# Patient Record
Sex: Male | Born: 1995 | Race: White | Hispanic: No | Marital: Single | Smoking: Current every day smoker
Health system: Southern US, Community
[De-identification: ages and names within clinical notes are randomized; demographics above are authoritative.]

---

## 2020-04-29 ENCOUNTER — Emergency Department (HOSPITAL_COMMUNITY)
Admission: EM | Admit: 2020-04-29 | Discharge: 2020-04-29 | Disposition: A | Discharge status: Home / Self Care | Payer: No Typology Code available for payment source | Attending: Emergency Medicine | Admitting: Emergency Medicine

## 2020-04-29 ENCOUNTER — Emergency Department (HOSPITAL_COMMUNITY): Payer: No Typology Code available for payment source

## 2020-04-29 ENCOUNTER — Encounter (HOSPITAL_COMMUNITY): Payer: Self-pay

## 2020-04-29 ENCOUNTER — Other Ambulatory Visit: Payer: Self-pay

## 2020-04-29 DIAGNOSIS — S2231XA Fracture of one rib, right side, initial encounter for closed fracture: Secondary | ICD-10-CM | POA: Diagnosis not present

## 2020-04-29 DIAGNOSIS — F17200 Nicotine dependence, unspecified, uncomplicated: Secondary | ICD-10-CM | POA: Insufficient documentation

## 2020-04-29 DIAGNOSIS — M549 Dorsalgia, unspecified: Secondary | ICD-10-CM

## 2020-04-29 DIAGNOSIS — R109 Unspecified abdominal pain: Secondary | ICD-10-CM | POA: Diagnosis not present

## 2020-04-29 DIAGNOSIS — W19XXXA Unspecified fall, initial encounter: Secondary | ICD-10-CM | POA: Insufficient documentation

## 2020-04-29 DIAGNOSIS — S32009A Unspecified fracture of unspecified lumbar vertebra, initial encounter for closed fracture: Secondary | ICD-10-CM | POA: Diagnosis not present

## 2020-04-29 DIAGNOSIS — S3992XA Unspecified injury of lower back, initial encounter: Secondary | ICD-10-CM | POA: Diagnosis present

## 2020-04-29 LAB — CBC WITH DIFFERENTIAL/PLATELET
Abs Immature Granulocytes: 0.07 10*3/uL (ref 0.00–0.07)
Basophils Absolute: 0.1 10*3/uL (ref 0.0–0.1)
Basophils Relative: 0 %
Eosinophils Absolute: 0 10*3/uL (ref 0.0–0.5)
Eosinophils Relative: 0 %
HCT: 45.6 % (ref 39.0–52.0)
Hemoglobin: 15.2 g/dL (ref 13.0–17.0)
Immature Granulocytes: 0 %
Lymphocytes Relative: 14 %
Lymphs Abs: 2.2 10*3/uL (ref 0.7–4.0)
MCH: 29.3 pg (ref 26.0–34.0)
MCHC: 33.3 g/dL (ref 30.0–36.0)
MCV: 87.9 fL (ref 80.0–100.0)
Monocytes Absolute: 1.1 10*3/uL — ABNORMAL HIGH (ref 0.1–1.0)
Monocytes Relative: 7 %
Neutro Abs: 12.4 10*3/uL — ABNORMAL HIGH (ref 1.7–7.7)
Neutrophils Relative %: 79 %
Platelets: 236 10*3/uL (ref 150–400)
RBC: 5.19 MIL/uL (ref 4.22–5.81)
RDW: 12.7 % (ref 11.5–15.5)
WBC: 15.8 10*3/uL — ABNORMAL HIGH (ref 4.0–10.5)
nRBC: 0 % (ref 0.0–0.2)

## 2020-04-29 LAB — HEPATIC FUNCTION PANEL
ALT: 29 U/L (ref 0–44)
AST: 32 U/L (ref 15–41)
Albumin: 4.1 g/dL (ref 3.5–5.0)
Alkaline Phosphatase: 62 U/L (ref 38–126)
Bilirubin, Direct: <0.1 mg/dL (ref 0.0–0.2)
Total Bilirubin: 0.3 mg/dL (ref 0.3–1.2)
Total Protein: 6.9 g/dL (ref 6.5–8.1)

## 2020-04-29 LAB — I-STAT CHEM 8, ED
BUN: 14 mg/dL (ref 6–20)
Calcium, Ion: 1.18 mmol/L (ref 1.15–1.40)
Chloride: 107 mmol/L (ref 98–111)
Creatinine, Ser: 0.7 mg/dL (ref 0.61–1.24)
Glucose, Bld: 94 mg/dL (ref 70–99)
HCT: 44 % (ref 39.0–52.0)
Hemoglobin: 15 g/dL (ref 13.0–17.0)
Potassium: 3.4 mmol/L — ABNORMAL LOW (ref 3.5–5.1)
Sodium: 143 mmol/L (ref 135–145)
TCO2: 25 mmol/L (ref 22–32)

## 2020-04-29 LAB — BASIC METABOLIC PANEL
Anion gap: 12 (ref 5–15)
BUN: 13 mg/dL (ref 6–20)
CO2: 22 mmol/L (ref 22–32)
Calcium: 9.3 mg/dL (ref 8.9–10.3)
Chloride: 108 mmol/L (ref 98–111)
Creatinine, Ser: 0.84 mg/dL (ref 0.61–1.24)
GFR, Estimated: >60 mL/min (ref >60)
Glucose, Bld: 96 mg/dL (ref 70–99)
Potassium: 3.5 mmol/L (ref 3.5–5.1)
Sodium: 142 mmol/L (ref 135–145)

## 2020-04-29 LAB — PROTIME-INR
INR: 1.1 (ref 0.8–1.2)
Prothrombin Time: 14 seconds (ref 11.4–15.2)

## 2020-04-29 MED ORDER — CYCLOBENZAPRINE HCL 10 MG PO TABS: 10.0000 mg | ORAL_TABLET | Freq: Two times a day (BID) | ORAL | 0 refills | Status: DC | PRN

## 2020-04-29 MED ORDER — FENTANYL CITRATE (PF) 100 MCG/2ML IJ SOLN
100.0000 ug | Freq: Once | INTRAMUSCULAR | Status: AC
  Administered 2020-04-29: 100 ug via INTRAVENOUS
  Filled 2020-04-29: qty 2

## 2020-04-29 MED ORDER — FENTANYL CITRATE (PF) 100 MCG/2ML IJ SOLN
50.0000 ug | Freq: Once | INTRAMUSCULAR | Status: AC
  Administered 2020-04-29: 50 ug via INTRAVENOUS
  Filled 2020-04-29: qty 2

## 2020-04-29 MED ORDER — OXYCODONE-ACETAMINOPHEN 5-325 MG PO TABS
1.0000 | ORAL_TABLET | ORAL | 0 refills | Status: AC | PRN
Start: 2020-04-29 — End: 2020-05-02

## 2020-04-29 MED ORDER — IOHEXOL 300 MG/ML  SOLN
125.0000 mL | Freq: Once | INTRAMUSCULAR | Status: AC | PRN
  Administered 2020-04-29: 125 mL via INTRAVENOUS

## 2020-04-29 MED ORDER — IOHEXOL 300 MG/ML  SOLN: 125.0000 mL | Freq: Once | INTRAMUSCULAR | Status: DC | PRN

## 2020-04-29 MED ORDER — OXYCODONE-ACETAMINOPHEN 5-325 MG PO TABS
1.0000 | ORAL_TABLET | Freq: Once | ORAL | Status: AC
  Administered 2020-04-29: 1 via ORAL
  Filled 2020-04-29: qty 1

## 2020-04-29 NOTE — ED Triage Notes (Signed)
Pt from urgent care; 3 pm pt fell from deck when deck collapsed, fell 10-15 feet; no loc, did not hit head, no thinners; c/o pain R side with movement, inspiration; tenderness to R hip and abdomen, R flank, and R axillary space; no swelling or extensive bruising noted with ems; pt endorses feeling a "pop" on back/mid-axillary space when being moved by ems; mild tender back pain; pt and o x 4 on arrival; pain went from 10 to 4 with 200 mcg fentanyl total; no neuro deficits, ambulatory on scene  122/78 HR 82 RR 18 100% RA

## 2020-04-29 NOTE — ED Notes (Signed)
Pt waiting for registration

## 2020-04-29 NOTE — ED Notes (Signed)
Pt back from ct

## 2020-04-29 NOTE — ED Notes (Signed)
X-ray at bedside

## 2020-04-29 NOTE — ED Provider Notes (Signed)
[facility] EMERGENCY DEPARTMENT Provider Note   CSN: [id number] Arrival date & time: 04/29/20  1839     History Chief Complaint  Patient presents with  . Fall    Aaron Day is a 24 y.o. male.  No medical problems, not on blood thinners presents to ER after fall.  Estimates he fell approximately 10 feet onto his right side from deck.  Is having severe pain along his right side, right lower back.  10 out of 10, worse with movement, improved with fentanyl from EMS.  No chest pain, no difficulty breathing.  He denies head trauma, no neck pain, no loss of consciousness.  HPI     History reviewed. No pertinent past medical history.  There are no problems to display for this patient.   History reviewed. No pertinent surgical history.     No family history on file.  Social History   Tobacco Use  . Smoking status: Current Every Day Smoker    Packs/day: 1.00  Substance Use Topics  . Alcohol use: Yes    Comment: occ  . Drug use: Yes    Types: Marijuana    Home Medications Prior to Admission medications   Medication Sig Start Date End Date Taking? Authorizing Provider  cyclobenzaprine (FLEXERIL) 10 MG tablet Take 1 tablet (10 mg total) by mouth 2 (two) times daily as needed for muscle spasms. 04/29/20   Milagros Loll, MD  oxyCODONE-acetaminophen (PERCOCET) 5-325 MG tablet Take 1 tablet by mouth every 4 (four) hours as needed for up to 3 days for severe pain. 04/29/20 05/02/20  Milagros Loll, MD    Allergies    Ceclor [cefaclor] and Keflex [cephalexin]  Review of Systems   Review of Systems  Constitutional: Negative for chills and fever.  HENT: Negative for ear pain and sore throat.   Eyes: Negative for pain and visual disturbance.  Respiratory: Negative for cough and shortness of breath.   Cardiovascular: Negative for chest pain and palpitations.  Gastrointestinal: Positive for abdominal pain. Negative for vomiting.  Genitourinary:  Positive for frequency. Negative for dysuria and hematuria.  Musculoskeletal: Positive for back pain. Negative for arthralgias.  Skin: Negative for color change and rash.  Neurological: Negative for seizures and syncope.  All other systems reviewed and are negative.   Physical Exam Updated Vital Signs BP 110/88 (BP Location: Right Arm)   Pulse 88   Temp 99 F (37.2 C) (Oral)   Resp 16   Ht 6' (1.829 m)   Wt 117.9 kg   SpO2 99%   BMI 35.26 kg/m   Physical Exam Vitals and nursing note reviewed.  Constitutional:      Appearance: He is well-developed.  HENT:     Head: Normocephalic and atraumatic.  Eyes:     Conjunctiva/sclera: Conjunctivae normal.  Cardiovascular:     Rate and Rhythm: Normal rate and regular rhythm.     Heart sounds: No murmur heard.   Pulmonary:     Effort: Pulmonary effort is normal. No respiratory distress.     Breath sounds: Normal breath sounds.     Comments: Tenderness to palpation over right lower chest wall Abdominal:     Palpations: Abdomen is soft.     Comments: Tenderness to palpation over right side of abdomen, right flank, no rebound or guarding  Musculoskeletal:     Cervical back: Neck supple.     Comments: Back: There is tenderness along L spine, no step off or deformity RUE: no  TTP throughout, no deformity, normal joint ROM, radial pulse intact, distal sensation and motor intact LUE: no TTP throughout, no deformity, normal joint ROM, radial pulse intact, distal sensation and motor intact RLE:  no TTP throughout, no deformity, normal joint ROM, distal pulse, sensation and motor intact LLE: no TTP throughout, no deformity, normal joint ROM, distal pulse, sensation and motor intact  Skin:    General: Skin is warm and dry.  Neurological:     Mental Status: He is alert.     ED Results / Procedures / Treatments   Labs (all labs ordered are listed, but only abnormal results are displayed) Labs Reviewed  CBC WITH DIFFERENTIAL/PLATELET -  Abnormal; Notable for the following components:      Result Value   WBC 15.8 (*)    Neutro Abs 12.4 (*)    Monocytes Absolute 1.1 (*)    All other components within normal limits  I-STAT CHEM 8, ED - Abnormal; Notable for the following components:   Potassium 3.4 (*)    All other components within normal limits  BASIC METABOLIC PANEL  PROTIME-INR  HEPATIC FUNCTION PANEL    EKG None  Radiology DG Ribs Unilateral W/Chest Right  Result Date: 04/29/2020 CLINICAL DATA:  Right-sided pain status post fall. EXAM: RIGHT RIBS AND CHEST - 3+ VIEW COMPARISON:  None. FINDINGS: Findings are suspicious for nondisplaced fracture of the posterior twelfth rib on the right. There is no pneumothorax. The lungs are clear. IMPRESSION: Findings suspicious for nondisplaced fracture of the posterior right twelfth rib. No pneumothorax. Electronically Signed   By: Katherine Mantle M.D.   On: 04/29/2020 20:17   CT ABDOMEN PELVIS W CONTRAST  Result Date: 04/29/2020 CLINICAL DATA:  Larey Seat 10 foot from staircase, back pain EXAM: CT ABDOMEN AND PELVIS WITH CONTRAST TECHNIQUE: Multidetector CT imaging of the abdomen and pelvis was performed using the standard protocol following bolus administration of intravenous contrast. CONTRAST:  OMNIPAQUE IOHEXOL 300 MG/ML  SOLN COMPARISON:  CT 04/29/2020, radiograph 04/29/2020 FINDINGS: Lower chest: No acute abnormality. Hepatobiliary: No focal liver abnormality is seen. No gallstones, gallbladder wall thickening, or biliary dilatation. Pancreas: Unremarkable. No pancreatic ductal dilatation or surrounding inflammatory changes. Spleen: Normal in size without focal abnormality. Adrenals/Urinary Tract: Adrenal glands are unremarkable. Kidneys are normal, without renal calculi, focal lesion, or hydronephrosis. Bladder is unremarkable. Stomach/Bowel: Stomach is within normal limits. Appendix appears normal. No evidence of bowel wall thickening, distention, or inflammatory changes.  Vascular/Lymphatic: No significant vascular findings are present. No enlarged abdominal or pelvic lymph nodes. Reproductive: Prostate is unremarkable. Other: No abdominal wall hernia or abnormality. No abdominopelvic ascites. Musculoskeletal: Acute nondisplaced right transverse process fractures at L1, L2 and L3. IMPRESSION: 1. No CT evidence for acute intra-abdominal or pelvic abnormality. 2. Acute nondisplaced right transverse process fractures at L1, L2 and L3. Electronically Signed   By: Jasmine Pang M.D.   On: 04/29/2020 21:33   CT L-SPINE NO CHARGE  Result Date: 04/29/2020 CLINICAL DATA:  Fall down 10 foot staircase. Staircase gave way. Back pain. EXAM: CT LUMBAR SPINE WITHOUT CONTRAST TECHNIQUE: Multidetector CT imaging of the lumbar spine was performed without intravenous contrast administration. Multiplanar CT image reconstructions were also generated. COMPARISON:  None FINDINGS: Segmentation: 5 non rib-bearing lumbar type vertebral bodies are present. The lowest fully formed vertebral body is L5. Alignment: No significant listhesis is present. Vertebrae: Vertebral body heights and alignment are maintained. Nondisplaced right transverse process fractures are present at L1, L2, and L3. Degenerative changes are noted  at the SI joints. No additional fractures are present. Hips are located. Paraspinal and other soft tissues: Paraspinous soft tissues are within limits. Please see CT abdomen of same day for further details. Disc levels: Rightward disc protrusion is present at L5-S1 mild right foraminal narrowing. No other focal disc disease or stenosis is present. IMPRESSION: 1. Nondisplaced right transverse process fractures at L1, L2, and L3. 2. No other acute or healing fractures. 3. Rightward disc protrusion at L5-S1 with mild right foraminal narrowing. Electronically Signed   By: Marin Roberts M.D.   On: 04/29/2020 21:17    Procedures Procedures (including critical care time)  Medications  Ordered in ED Medications  fentaNYL (SUBLIMAZE) injection 100 mcg (100 mcg Intravenous Given 04/29/20 2013)  iohexol (OMNIPAQUE) 300 MG/ML solution 125 mL (125 mLs Intravenous Contrast Given 04/29/20 2039)  fentaNYL (SUBLIMAZE) injection 50 mcg (50 mcg Intravenous Given 04/29/20 2233)  oxyCODONE-acetaminophen (PERCOCET/ROXICET) 5-325 MG per tablet 1 tablet (1 tablet Oral Given 04/29/20 2230)    ED Course  I have reviewed the triage vital signs and the nursing notes.  Pertinent labs & imaging results that were available during my care of the patient were reviewed by me and considered in my medical decision making (see chart for details).    MDM Rules/Calculators/A&P                          24 year old male fell from approximately 10 feet.  On physical exam noted tenderness over right flank, right lower back.  Plain films of the chest concerning for possible nondisplaced 12th rib fracture.  CT abdomen pelvis negative for acute abdominal pelvic pathology however, CT of L-spine was concerning for nondisplaced transverse process fractures of L1 and L3.  Reviewed with neurosurgery on-call, discussed with PA Dell Ponto who reviewed with [place] - no role for bracing, pain control and discharge okay for this injury.  Pain well controlled in ER, gave incentive spirometer, Rx for pain medicine and muscle relaxer.  Follow-up with primary doctor and neurosurgery.    After the discussed management above, the patient was determined to be safe for discharge.  The patient was in agreement with this plan and all questions regarding their care were answered.  ED return precautions were discussed and the patient will return to the ED with any significant worsening of condition.  Final Clinical Impression(s) / ED Diagnoses Final diagnoses:  Back pain  Closed fracture of transverse process of lumbar vertebra, initial encounter (HCC)  Closed fracture of one rib of right side, initial encounter    Rx / DC  Orders ED Discharge Orders         Ordered    oxyCODONE-acetaminophen (PERCOCET) 5-325 MG tablet  Every 4 hours PRN        04/29/20 2310    cyclobenzaprine (FLEXERIL) 10 MG tablet  2 times daily PRN        04/29/20 2310           Milagros Loll, MD 04/30/20 1628

## 2020-04-29 NOTE — Discharge Instructions (Addendum)
Recommend taking Tylenol and Motrin for pain control.  For breakthrough pain, take the prescribed Percocet as needed.  Additionally take the prescribed muscle relaxer as needed.  Please note both of these medications can make you drowsy and should not be taken while driving or operating heavy machinery.  Use the incentive spirometer as discussed.  If you develop uncontrolled pain, difficulty in breathing, chest pain, worse abdominal pain or other new concerning symptom, return to ER for reassessment.  Recommend follow-up with your primary doctor as well as neurosurgery.

## 2020-04-29 NOTE — ED Notes (Signed)
Pt back form xray.

## 2020-05-06 ENCOUNTER — Ambulatory Visit: Payer: Self-pay | Attending: Internal Medicine | Admitting: Internal Medicine

## 2020-05-06 ENCOUNTER — Encounter: Payer: Self-pay | Admitting: Internal Medicine

## 2020-05-06 ENCOUNTER — Other Ambulatory Visit: Payer: Self-pay

## 2020-05-06 VITALS — BP 126/86 | HR 91 | Resp 16 | Ht 72.0 in | Wt 281.0 lb

## 2020-05-06 DIAGNOSIS — S32009A Unspecified fracture of unspecified lumbar vertebra, initial encounter for closed fracture: Secondary | ICD-10-CM

## 2020-05-06 DIAGNOSIS — F17200 Nicotine dependence, unspecified, uncomplicated: Secondary | ICD-10-CM

## 2020-05-06 DIAGNOSIS — S2231XA Fracture of one rib, right side, initial encounter for closed fracture: Secondary | ICD-10-CM

## 2020-05-06 DIAGNOSIS — R030 Elevated blood-pressure reading, without diagnosis of hypertension: Secondary | ICD-10-CM

## 2020-05-06 MED ORDER — CYCLOBENZAPRINE HCL 10 MG PO TABS: 10.0000 mg | ORAL_TABLET | Freq: Two times a day (BID) | ORAL | 0 refills | Status: DC | PRN

## 2020-05-06 MED ORDER — OXYCODONE-ACETAMINOPHEN 5-325 MG PO TABS: 1.0000 | ORAL_TABLET | Freq: Three times a day (TID) | ORAL | 0 refills | Status: AC | PRN

## 2020-05-06 NOTE — Progress Notes (Signed)
Patient ID: Aaron Day, male    DOB: 22-May-1996  MRN: [id number]  CC: Hospitalization Follow-up (ED)   Subjective: Aaron Day is a 24 y.o. male who presents for new pt visit and f/u from ER. His concerns today include:  Hx of tob dep, panic/anxiety disorder  Previous was Dr. Rance MuirGuild with [facility] in [place].  Last seen about 2 yrs ago.  Lives [place] now.  Past hx of anxiety/panic disorder but not an active issue for him at this time.  No chronic meds.    Patient seen in the emergency room 04/29/2020 after he fell about 10 feet when a flight of deck stairs collapsed under him.  He fell onto his right side.  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  He was seen in the emergency room complaining of pain on his right side and right lower back.  X-ray of the ribs revealed a possible nondisplaced fracture of the posterior 12th rib.  CAT scan of the abdomen revealed no acute intra-abdominal or pelvic abnormality but did show acute nondisplaced right transverse process fractures at L1, L2 and L3.  CAT scan of the lumbar spine also revealed a nondisplaced fracture of the right transverse processes and a rightward disc protrusion at L5-S1.  The case was reviewed with the neurosurgeon on call who is Dr. Johnsie Cancelstergaard.  No role for bracing.  Was recommended that he be sent home with pain control and to follow-up with his PCP and the neurosurgeon.  Patient discharged with a limited supply of oxycodone and Flexeril.  Since then, patient reports that he still has pain around the lower rib cage anteriorly and posteriorly on the right side.  It is worse with movement of the trunk, coughing, sneezing or bending.  The pain in the right lower back he feels is more manageable compared to the pain around the rib cage.  However the back pain is worse when he is having to get up out of a chair or his car and getting out of bed.  This morning was the first morning that he was able to  sit up in bed without having to roll out of bed.  Pain is 10 out of 10 when moving his trunk.  However if he is just sitting the pain is about 5-6/10.  Reports having had a slipped disc 2 years ago associated with heavy lifting. -He denies any radiation of pain down the legs.  No numbness or tingling in the legs.  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 -He finished the oxycodone that was prescribed from the ER on the eighth.  Took the last dose this morning.  He reports some dizziness when he lays flat on his back in bed after taking the oxycodone and Flexeril.  No drowsiness when he is driving or up moving around.  He tried calling Dr. Johnsie Cancelstergaard office today and was told that he can call them back for an appointment once he has a claims # for his Workmen's Comp.  Tobacco dependence: He smokes about a pack a day and has smoked for 10 years.  He is wanting to quit.  He is thinking about using vaping as a method to help him quit.  Tried Chantix in  the past but stopped it due to vivid dreams.  Try the nicotine gum.  He has found that just chewing any gum helps. -He uses marijuana a few times a week.  Denies any other street drug use. -Gives family history of dependence on controlled substances in both of his parents, father now deceased.  Past medical, social, family history, surgical history reviewed and updated.  No current outpatient medications on file prior to visit.   No current facility-administered medications on file prior to visit.    Allergies  Allergen Reactions  . Ceclor [Cefaclor] Anaphylaxis  . Keflex [Cephalexin] Anaphylaxis    Social History   Socioeconomic History  . Marital status: Single    Spouse name: Not on file  . Number of children: 0  . Years of education: Not on file  . Highest education level:  12th grade  Occupational History  . Occupation: pest control  Tobacco Use  . Smoking status: Current Every Day Smoker    Packs/day: 1.00    Years: 10.00    Pack years: 10.00  . Smokeless tobacco: Never Used  Vaping Use  . Vaping Use: Never used  Substance and Sexual Activity  . Alcohol use: Yes    Comment: occ  . Drug use: Yes    Types: Marijuana    Comment: 1-2 x/wk  . Sexual activity: Not on file  Other Topics Concern  . Not on file  Social History Narrative  . Not on file   Social Determinants of Health   Financial Resource Strain:   . Difficulty of Paying Living Expenses: Not on file  Food Insecurity:   . Worried About Programme researcher, broadcasting/film/video in the Last Year: Not on file  . Ran Out of Food in the Last Year: Not on file  Transportation Needs:   . Lack of Transportation (Medical): Not on file  . Lack of Transportation (Non-Medical): Not on file  Physical Activity:   . Days of Exercise per Week: Not on file  . Minutes of Exercise per Session: Not on file  Stress:   . Feeling of Stress : Not on file  Social Connections:   . Frequency of Communication with Friends and Family: Not on file  . Frequency of Social Gatherings with Friends and Family: Not on file  . Attends Religious Services: Not on file  . Active Member of Clubs or Organizations: Not on file  . Attends Banker Meetings: Not on file  . Marital Status: Not on file  Intimate Partner Violence:   . Fear of Current or Ex-Partner: Not on file  . Emotionally Abused: Not on file  . Physically Abused: Not on file  . Sexually Abused: Not on file    Family History  Problem Relation Age of Onset  . Crohn's disease Father   . Crohn's disease Sister   . Crohn's disease Paternal Uncle   . Breast cancer Maternal Grandmother     No past surgical history on file.  ROS: Review of Systems Negative except as stated above  PHYSICAL EXAM: BP 126/86   Pulse 91   Resp 16   Ht 6' (1.829 m)   Wt 281 lb  (127.5 kg)   SpO2 97%   BMI 38.11 kg/m   Physical Exam  General appearance - alert, well appearing, obese young Caucasian male and in no distress Mental status - normal mood, behavior, speech, dress, motor activity, and thought processes Chest - clear to auscultation, no wheezes, rales or  rhonchi, symmetric air entry Heart - normal rate, regular rhythm, normal S1, S2, no murmurs, rubs, clicks or gallops Neurological -power in both lower extremities 5/5 bilaterally.  Gross sensation intact in the legs.  Patient thought it would be uncomfortable for him to lay back for [place] to do straight leg raise maneuver so I decided not to do it. Musculoskeletal -he is exquisitely tender on the right lower rib cage anteriorly laterally and posteriorly.  Mild to moderate tenderness on palpation of the right-sided lumbar paraspinal muscle.  No tenderness on palpation over the lumbar spine.   Extremities -no lower extremity edema.  CMP Latest Ref Rng & Units 04/29/2020 04/29/2020  Glucose 70 - 99 mg/dL 94 96  BUN 6 - 20 mg/dL 14 13  Creatinine 8.29 - 1.24 mg/dL 5.62 1.30  Sodium 865 - 145 mmol/L 143 142  Potassium 3.5 - 5.1 mmol/L 3.4(L) 3.5  Chloride 98 - 111 mmol/L 107 108  CO2 22 - 32 mmol/L - 22  Calcium 8.9 - 10.3 mg/dL - 9.3  Total Protein 6.5 - 8.1 g/dL - 6.9  Total Bilirubin 0.3 - 1.2 mg/dL - 0.3  Alkaline Phos 38 - 126 U/L - 62  AST 15 - 41 U/L - 32  ALT 0 - 44 U/L - 29   Lipid Panel  No results found for: CHOL, TRIG, HDL, CHOLHDL, VLDL, LDLCALC, LDLDIRECT  CBC    Component Value Date/Time   WBC 15.8 (H) 04/29/2020 1901   RBC 5.19 04/29/2020 1901   HGB 15.0 04/29/2020 1946   HCT 44.0 04/29/2020 1946   PLT 236 04/29/2020 1901   MCV 87.9 04/29/2020 1901   MCH 29.3 04/29/2020 1901   MCHC 33.3 04/29/2020 1901   RDW 12.7 04/29/2020 1901   LYMPHSABS 2.2 04/29/2020 1901   MONOABS 1.1 (H) 04/29/2020 1901   EOSABS 0.0 04/29/2020 1901   BASOSABS 0.1 04/29/2020 1901    ASSESSMENT AND  PLAN: 1. Closed fracture of one rib of right side, initial encounter 2. Lumbar transverse process fracture, closed, initial encounter [facility]) Patient without history to suggest radiculopathy at this time. Advised him that it will take several weeks for nondisplaced fractures to heal. Advised to avoid any lifting for the next 4 to 6 weeks. Recommend using a heating pad as needed. Refill given on Flexeril.  Advised that medication can cause drowsiness and to use it as needed. I have given a refill on oxycodone.  Advised patient to use it as needed for pain.  Advised that it will be prescribed for a limited time period and not for long-term.  Advised that it is a controlled substance with the potential of causing dependence and some people can abuse of the medication.  Advised to take as prescribed, keep the medication safe and not share the medication with others.  I have referred him to the neurosurgeon. [place] controlled substance reporting system reviewed. - cyclobenzaprine (FLEXERIL) 10 MG tablet; Take 1 tablet (10 mg total) by mouth 2 (two) times daily as needed for muscle spasms.  Dispense: 30 tablet; Refill: 0 - oxyCODONE-acetaminophen (PERCOCET) 5-325 MG tablet; Take 1 tablet by mouth every 8 (eight) hours as needed for moderate pain or severe pain.  Dispense: 60 tablet; Refill: 0 - Ambulatory referral to Neurosurgery  3. Tobacco dependence Advised to quit.  Discussed health risks associated with smoking.  Patient is wanting to quit and is exploring his options.  Advised against vaping as it can cause acute lung injury.  Discussed nicotine replacement  therapy with him and encouraged him to think about it.  4. Elevated blood-pressure reading without diagnosis of hypertension DASH diet discussed and encouraged.    Patient was given the opportunity to ask questions.  Patient verbalized understanding of the plan and was able to repeat key elements of the plan.   Orders Placed This  Encounter  Procedures  . Ambulatory referral to Neurosurgery     Requested Prescriptions   Signed Prescriptions Disp Refills  . cyclobenzaprine (FLEXERIL) 10 MG tablet 30 tablet 0    Sig: Take 1 tablet (10 mg total) by mouth 2 (two) times daily as needed for muscle spasms.  Marland Kitchen oxyCODONE-acetaminophen (PERCOCET) 5-325 MG tablet 60 tablet 0    Sig: Take 1 tablet by mouth every 8 (eight) hours as needed for moderate pain or severe pain.    No follow-ups on file.  Jonah Blue, MD, FACP

## 2020-05-06 NOTE — Patient Instructions (Signed)
I have given a limited refill on oxycodone and cyclobenzaprine for you to use as needed for pain.  Both medications can cause drowsiness.  Do not take them when you have to drive or operate any heavy machinery.  There is risk of dependence and abuse when using any controlled substance like oxycodone.  Therefore we will use it for just a short period of time.  I would avoid any heavy lifting for the next 4 to 6 weeks.  You can use a heating pad to your lower back.  This will help also.

## 2020-05-28 ENCOUNTER — Other Ambulatory Visit: Payer: Self-pay | Admitting: Internal Medicine

## 2020-05-28 DIAGNOSIS — S32009A Unspecified fracture of unspecified lumbar vertebra, initial encounter for closed fracture: Secondary | ICD-10-CM

## 2020-05-28 DIAGNOSIS — S2231XA Fracture of one rib, right side, initial encounter for closed fracture: Secondary | ICD-10-CM

## 2020-05-28 NOTE — Telephone Encounter (Signed)
Requested medication (s) are due for refill today: yes  Requested medication (s) are on the active medication list: yes  Last refill:  05/06/20  Future visit scheduled: no  Notes to clinic:  med not delegated to NT to RF   Requested Prescriptions  Pending Prescriptions Disp Refills   cyclobenzaprine (FLEXERIL) 10 MG tablet [Pharmacy Med Name: CYCLOBENZAPRINE 10MG  TABLETS] 30 tablet 0    Sig: TAKE 1 TABLET BY MOUTH TWICE DAILY AS NEEDED FOR MUSCLE SPASMS      Not Delegated - Analgesics:  Muscle Relaxants Failed - 05/28/2020  4:45 PM      Failed - This refill cannot be delegated      Passed - Valid encounter within last 6 months    Recent Outpatient Visits           3 weeks ago Closed fracture of one rib of right side, initial encounter   [facility], MD

## 2021-03-07 ENCOUNTER — Encounter (HOSPITAL_BASED_OUTPATIENT_CLINIC_OR_DEPARTMENT_OTHER): Payer: Self-pay

## 2021-03-07 ENCOUNTER — Emergency Department (HOSPITAL_BASED_OUTPATIENT_CLINIC_OR_DEPARTMENT_OTHER)
Admission: EM | Admit: 2021-03-07 | Discharge: 2021-03-07 | Disposition: A | Discharge status: Home / Self Care | Payer: No Typology Code available for payment source | Attending: Emergency Medicine | Admitting: Emergency Medicine

## 2021-03-07 ENCOUNTER — Other Ambulatory Visit: Payer: Self-pay

## 2021-03-07 DIAGNOSIS — T63441A Toxic effect of venom of bees, accidental (unintentional), initial encounter: Secondary | ICD-10-CM | POA: Diagnosis not present

## 2021-03-07 DIAGNOSIS — F17210 Nicotine dependence, cigarettes, uncomplicated: Secondary | ICD-10-CM | POA: Insufficient documentation

## 2021-03-07 MED ORDER — EPINEPHRINE 0.3 MG/0.3ML IJ SOAJ: 0.3000 mg | INTRAMUSCULAR | 0 refills | Status: AC | PRN

## 2021-03-07 NOTE — ED Provider Notes (Signed)
[facility] EMERGENCY DEPARTMENT Provider Note   CSN: [id number] Arrival date & time: 03/07/21  1312     History Chief Complaint  Patient presents with   Insect Bite    Aaron Day is a 25 y.o. male.  HPI Patient brought in by EMS from urgent care.  Reportedly had allergic reaction to yellowjacket sting.  States that he had a previous allergic reaction but has been stung since without any issues.  States that around 1130 or 1145 was stung on the right shoulder.  Went to the urgent care and was given epinephrine because he felt his face was swelling up.  Got EpiPen and Solu-Medrol and Benadryl at around noon.  States he is feeling better now.  No difficulty breathing.  No cough.  No lightheadedness.  No nausea or vomiting.  No feeling of swelling in his mouth.  No difficulty breathing.    History reviewed. No pertinent past medical history.  Patient Active Problem List   Diagnosis Date Noted   Lumbar transverse process fracture, closed, initial encounter (HCC) 05/06/2020   Closed fracture of one rib of right side 05/06/2020   Tobacco dependence 05/06/2020    History reviewed. No pertinent surgical history.     Family History  Problem Relation Age of Onset   Crohn's disease Father    Crohn's disease Sister    Crohn's disease Paternal Uncle    Breast cancer Maternal Grandmother     Social History   Tobacco Use   Smoking status: Every Day    Packs/day: 1.00    Years: 10.00    Pack years: 10.00    Types: Cigarettes   Smokeless tobacco: Never  Vaping Use   Vaping Use: Never used  Substance Use Topics   Alcohol use: Yes    Comment: occ   Drug use: Yes    Types: Marijuana    Comment: 1-2 x/wk    Home Medications Prior to Admission medications   Medication Sig Start Date End Date Taking? Authorizing Provider  EPINEPHrine 0.3 mg/0.3 mL IJ SOAJ injection Inject 0.3 mg into the muscle as needed for anaphylaxis. 03/07/21  Yes Benjiman Core, MD   cyclobenzaprine (FLEXERIL) 10 MG tablet TAKE 1 TABLET BY MOUTH TWICE DAILY AS NEEDED FOR MUSCLE SPASMS 05/30/20   Marcine Matar, MD  oxyCODONE-acetaminophen (PERCOCET) 5-325 MG tablet Take 1 tablet by mouth every 8 (eight) hours as needed for moderate pain or severe pain. 05/06/20   Marcine Matar, MD    Allergies    Ceclor [cefaclor] and Keflex [cephalexin]  Review of Systems   Review of Systems  Constitutional:  Negative for appetite change.  HENT:  Positive for facial swelling.   Respiratory:  Negative for shortness of breath.   Cardiovascular:  Negative for chest pain.  Gastrointestinal:  Negative for abdominal pain.  Genitourinary:  Negative for flank pain.  Musculoskeletal:  Negative for back pain.  Skin:  Negative for rash.  Neurological:  Negative for weakness.  Psychiatric/Behavioral:  Negative for confusion.    Physical Exam Updated Vital Signs BP 121/63   Pulse 96   Temp 98.3 F (36.8 C) (Oral)   Resp (!) 28   Ht 6' (1.829 m)   Wt 127 kg   SpO2 98%   BMI 37.97 kg/m   Physical Exam Vitals and nursing note reviewed.  HENT:     Head: Atraumatic.     Mouth/Throat:     Comments: No oral or posterior pharyngeal swelling. Eyes:  Pupils: Pupils are equal, round, and reactive to light.  Cardiovascular:     Rate and Rhythm: Regular rhythm.  Pulmonary:     Breath sounds: No wheezing or rhonchi.  Abdominal:     Tenderness: There is no abdominal tenderness.  Musculoskeletal:        General: No tenderness.     Cervical back: Neck supple.  Skin:    Capillary Refill: Capillary refill takes less than 2 seconds.     Comments: Small area of insect sting on right shoulder.  Neurological:     Mental Status: He is alert and oriented to person, place, and time.  Psychiatric:        Mood and Affect: Mood normal.    ED Results / Procedures / Treatments   Labs (all labs ordered are listed, but only abnormal results are displayed) Labs Reviewed - No data to  display  EKG None  Radiology No results found.  Procedures Procedures   Medications Ordered in ED Medications - No data to display  ED Course  I have reviewed the triage vital signs and the nursing notes.  Pertinent labs & imaging results that were available during my care of the patient were reviewed by me and considered in my medical decision making (see chart for details).    MDM Rules/Calculators/A&P                           Patient with allergic reaction to yellowjacket sting.  Sting was around 11:00.  Seen at urgent care and given treatment but then sent here.  Given epinephrine injection.  Has been stable.  No further reaction.  No further swelling.  Feels better.  We will follow-up as an outpatient.  Discharge home. Final Clinical Impression(s) / ED Diagnoses Final diagnoses:  Allergic reaction to bee sting    Rx / DC Orders ED Discharge Orders          Ordered    EPINEPHrine 0.3 mg/0.3 mL IJ SOAJ injection  As needed        03/07/21 1431             Benjiman Core, MD 03/07/21 1504

## 2021-03-07 NOTE — ED Notes (Signed)
Pt received Epi, Solu-Medrol and Benadryl at 12pm

## 2021-03-07 NOTE — ED Triage Notes (Signed)
Pt. States yellow jacket sting R shoulder. Presented to Urgent Care. Here via EMS. In no distress upon arrival.

## 2021-03-07 NOTE — Discharge Instructions (Addendum)
You had an allergic reaction to the yellowjacket sting.  You have been prescribed an EpiPen.  Follow-up with your primary care doctor as needed.  Return for worsening symptoms

## 2021-05-28 IMAGING — DX DG RIBS W/ CHEST 3+V*R*
5 series · 5 of 5 positions shown · non-contrast
Comparison: None.

CLINICAL DATA: Right-sided pain status post fall.

EXAM:
RIGHT RIBS AND CHEST - 3+ VIEW

[rib ap (1 of 2)]
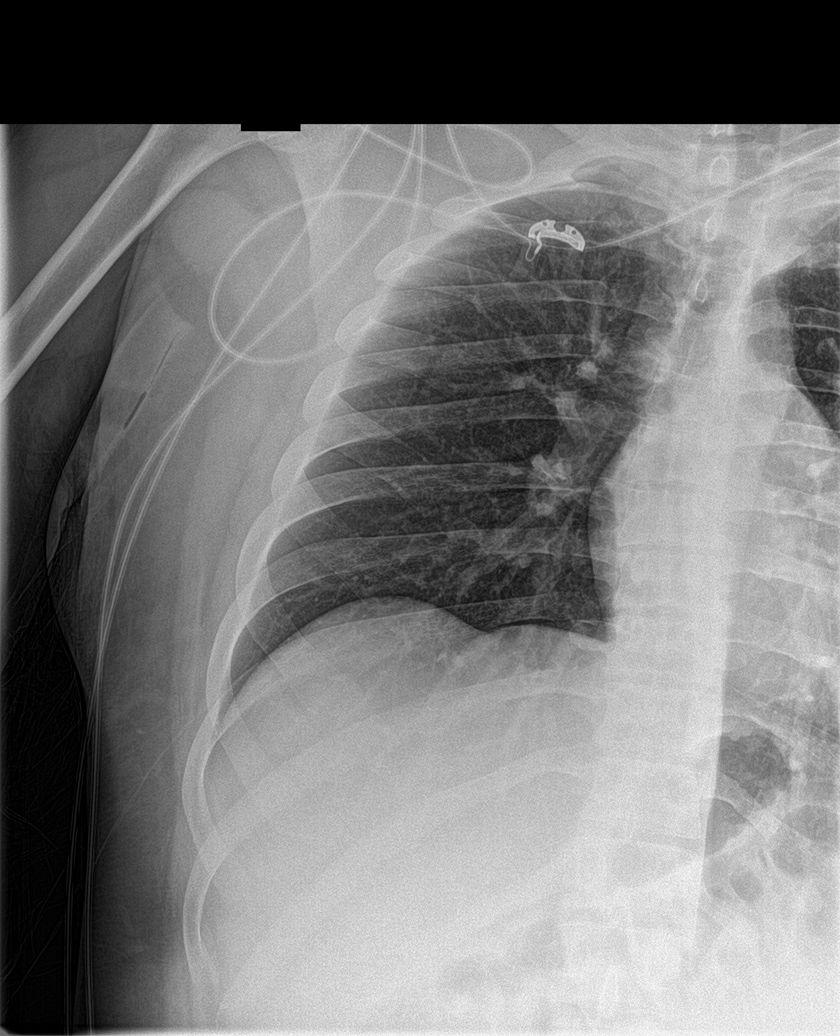

[rib ap obl (1 of 2)]
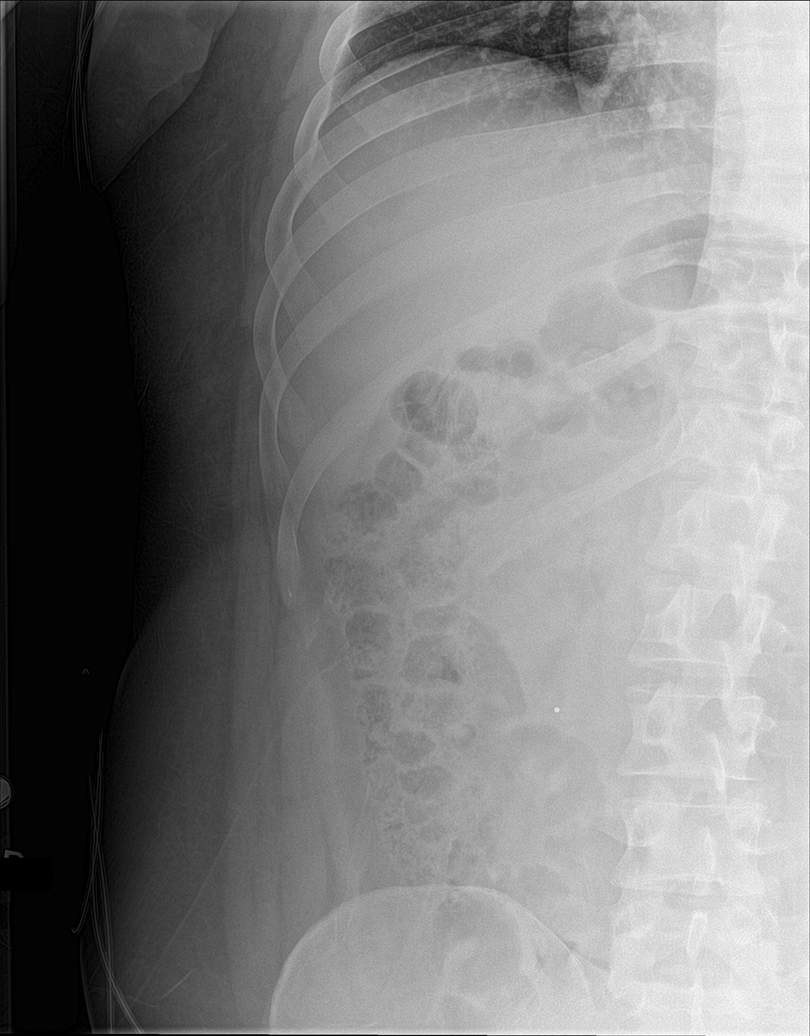

[chest ap]
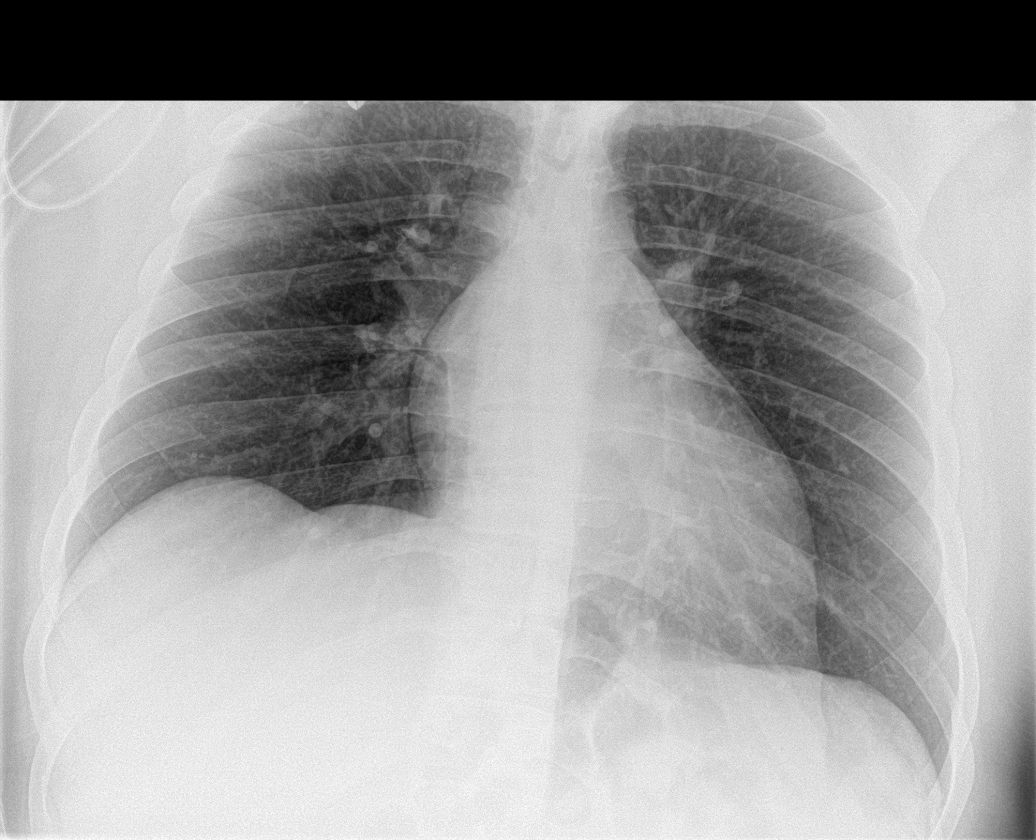

[rib ap (2 of 2)]
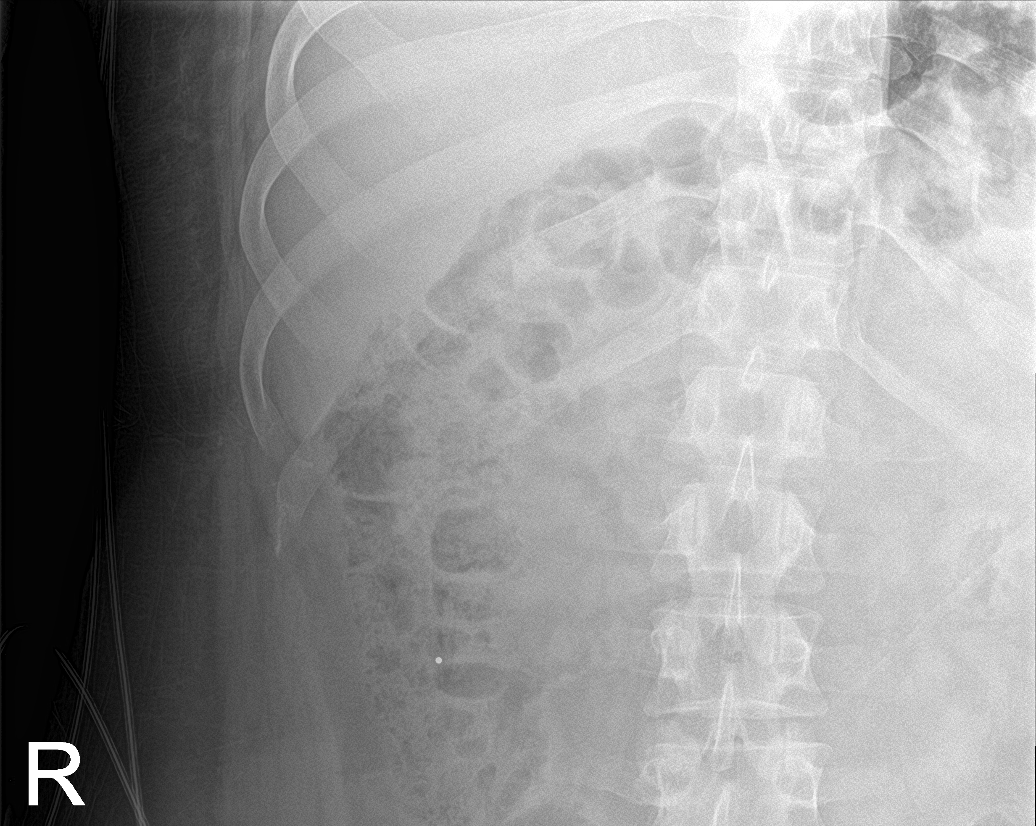

[rib ap obl (2 of 2)]
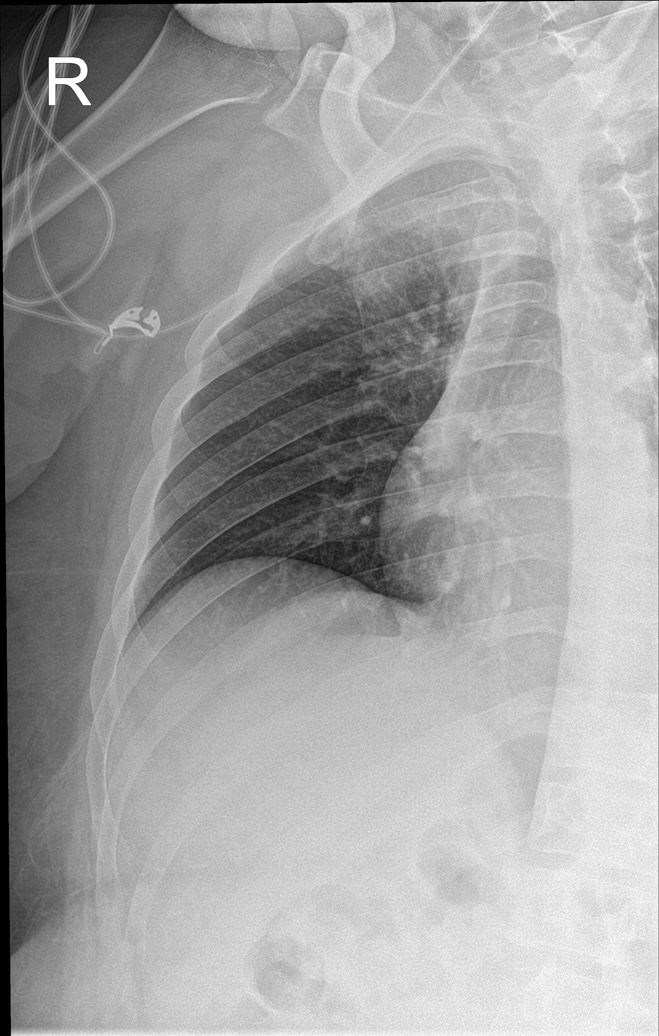

[5 of 5 positions shown; findings below may reference images not displayed]

FINDINGS: Findings are suspicious for nondisplaced fracture of the posterior
twelfth rib on the right. There is no pneumothorax. The lungs are
clear.
IMPRESSION: Findings suspicious for nondisplaced fracture of the posterior right
twelfth rib. No pneumothorax.
